# Patient Record
Sex: Male | Born: 1995 | Race: White | Hispanic: No | Marital: Single | Smoking: Current every day smoker
Health system: Southern US, Community
[De-identification: ages and names within clinical notes are randomized; demographics above are authoritative.]

---
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
## 2017-03-01 ENCOUNTER — Encounter (HOSPITAL_COMMUNITY): Payer: Self-pay | Admitting: *Deleted

## 2017-03-01 ENCOUNTER — Ambulatory Visit (INDEPENDENT_AMBULATORY_CARE_PROVIDER_SITE_OTHER): Payer: Medicaid Other

## 2017-03-01 ENCOUNTER — Ambulatory Visit (HOSPITAL_COMMUNITY)
Admission: EM | Admit: 2017-03-01 | Discharge: 2017-03-01 | Disposition: A | Discharge status: Home / Self Care | Payer: Medicaid Other | Attending: Family Medicine | Admitting: Family Medicine

## 2017-03-01 DIAGNOSIS — R0989 Other specified symptoms and signs involving the circulatory and respiratory systems: Secondary | ICD-10-CM | POA: Diagnosis not present

## 2017-03-01 MED ORDER — BENZONATATE 100 MG PO CAPS: 100.0000 mg | ORAL_CAPSULE | Freq: Three times a day (TID) | ORAL | 0 refills | Status: AC

## 2017-03-01 MED ORDER — AZITHROMYCIN 250 MG PO TABS: 250.0000 mg | ORAL_TABLET | Freq: Every day | ORAL | 0 refills | Status: AC

## 2017-03-01 MED ORDER — ALBUTEROL SULFATE HFA 108 (90 BASE) MCG/ACT IN AERS: 1.0000 | INHALATION_SPRAY | Freq: Four times a day (QID) | RESPIRATORY_TRACT | 0 refills | Status: AC | PRN

## 2017-03-01 MED ORDER — PREDNISONE 50 MG PO TABS: ORAL_TABLET | ORAL | 0 refills | Status: AC

## 2017-03-01 NOTE — Discharge Instructions (Signed)
I am treating you for possible pneumonia, started you on azithromycin, take 2 tablets today then 1 tablet daily till finished. Prednisone 1 tablet daily with food, for cough Tessalon 1 tablet every 8 hours as needed. For wheezing and shortness of breath albuterol inhaler 1-2 puffs every 4-6 hours as needed. Return to clinic in 2 weeks for follow-up assessment and possible reimaging. Wear respiratory protection while at work

## 2017-03-01 NOTE — ED Provider Notes (Signed)
CSN: [id number]     Arrival date & time 03/01/17  1110 History   None    Chief Complaint  Patient presents with  . Cough   (Consider location/radiation/quality/duration/timing/severity/associated sxs/prior Treatment) Luis Stevens is a 21 y.o. male with no reported past medical history, who presents to the [facility] with a chief complaint of cough that is been productive, hacking ongoing for [redacted] weeks along with general malaise. He denies any fever, or chills, no nausea, vomiting, or diarrhea, or other significant symptoms. He has not traveled outside of the country, he does smoke and he also vapes, and he also works in a [employer]. Family history is noncontributory.   The history is provided by the patient.  Cough  Associated symptoms: shortness of breath and wheezing     History reviewed. No pertinent past medical history. History reviewed. No pertinent surgical history. History reviewed. No pertinent family history. Social History  Substance Use Topics  . Smoking status: Current Every Day Smoker    Packs/day: 1.00    Years: 3.00    Types: Cigarettes  . Smokeless tobacco: Never Used  . Alcohol use No    Review of Systems  Constitutional: Negative.   HENT: Negative.   Respiratory: Positive for cough, shortness of breath and wheezing.   Cardiovascular: Negative.   Gastrointestinal: Negative.   Musculoskeletal: Negative.   Skin: Negative.   Neurological: Negative.     Allergies  Patient has no known allergies.  Home Medications   Prior to Admission medications   Medication Sig Start Date End Date Taking? Authorizing Provider  albuterol (PROVENTIL HFA;VENTOLIN HFA) 108 (90 Base) MCG/ACT inhaler Inhale 1-2 puffs into the lungs every 6 (six) hours as needed for wheezing or shortness of breath. 03/01/17   Dorena Bodo, NP  azithromycin (ZITHROMAX) 250 MG tablet Take 1 tablet (250 mg total) by mouth daily. Take first 2 tablets  together, then 1 every day until finished. 03/01/17   Dorena Bodo, NP  benzonatate (TESSALON) 100 MG capsule Take 1 capsule (100 mg total) by mouth every 8 (eight) hours. 03/01/17   Dorena Bodo, NP  predniSONE (DELTASONE) 50 MG tablet Take 1 tablet daily with food 03/01/17   Dorena Bodo, NP   Meds Ordered and Administered this Visit  Medications - No data to display  BP 122/74 (BP Location: Right Arm)   Pulse 78   Temp 98.6 F (37 C) (Oral)   Resp 18   SpO2 100%  No data found.   Physical Exam  Constitutional: He is oriented to person, place, and time. He appears well-developed and well-nourished. No distress.  HENT:  Head: Normocephalic and atraumatic.  Right Ear: External ear normal.  Left Ear: External ear normal.  Eyes: Conjunctivae are normal.  Neck: Normal range of motion.  Cardiovascular: Normal rate and regular rhythm.   Pulmonary/Chest: Effort normal. He has rhonchi in the right lower field.  Neurological: He is alert and oriented to person, place, and time.  Skin: Skin is warm and dry. Capillary refill takes less than 2 seconds. He is not diaphoretic.  Psychiatric: He has a normal mood and affect. His behavior is normal.  Nursing note and vitals reviewed.   Urgent Care Course     Procedures (including critical care time)  Labs Review Labs Reviewed - No data to display  Imaging Review Dg Chest 2 View  Result Date: 03/01/2017 CLINICAL DATA:  Cough.  Shortness of breath. EXAM: CHEST  2 VIEW  COMPARISON:  02/20/2013 FINDINGS: Ill-defined abnormal density overlies the anterior aspect of the right first rib. I suspect this represents a healing right first rib fracture. I doubt that it represents a mass or infiltrate in the right apex. The lungs are otherwise clear. Heart size and vascularity are normal. No effusions. The bones otherwise appear normal. IMPRESSION: Probable healing fracture of the anterior aspect of the right first rib. Does the patient have  a history of upper right chest trauma? Otherwise, normal exam. Electronically Signed   By: Francene BoyersJames  Maxwell M.D.   On: 03/01/2017 12:32       MDM   1. Abnormal lung sounds     Rhonchi significant right lower fields, chest x-ray negative for pneumonia, however there was a abnormality seen at anterior right first rib. He denies any history of trauma to this area, he does not have any pain. Recommend returning to clinic in 2 weeks for repeat images. He was started on azithromycin, prednisone, Tessalon, and albuterol. Provided counseling and follow-up guidelines as well as a work note for today.    Dorena BodoKennard, Marlea Gambill, NP 03/01/17 1312

## 2017-03-01 NOTE — ED Triage Notes (Signed)
Pt  Is  A   Smoker  He  Has  A  Cough   X   2   Weeks    With  Runny  Nose  At times          Pt  Is  Sitting  Upright  On  The  Exam table  In  Complete   sentances

## 2021-04-04 ENCOUNTER — Ambulatory Visit (INDEPENDENT_AMBULATORY_CARE_PROVIDER_SITE_OTHER): Payer: Self-pay

## 2021-04-04 ENCOUNTER — Other Ambulatory Visit: Payer: Self-pay

## 2021-04-04 ENCOUNTER — Other Ambulatory Visit: Payer: Self-pay | Admitting: Physician Assistant

## 2021-04-04 DIAGNOSIS — Z021 Encounter for pre-employment examination: Secondary | ICD-10-CM

## 2022-10-09 IMAGING — DX DG CHEST 1V
1 series · 1 of 1 positions shown · non-contrast
Comparison: 03/01/2017

CLINICAL DATA: Pre-employment examination.

EXAM:
CHEST  1 VIEW

[chest pa]
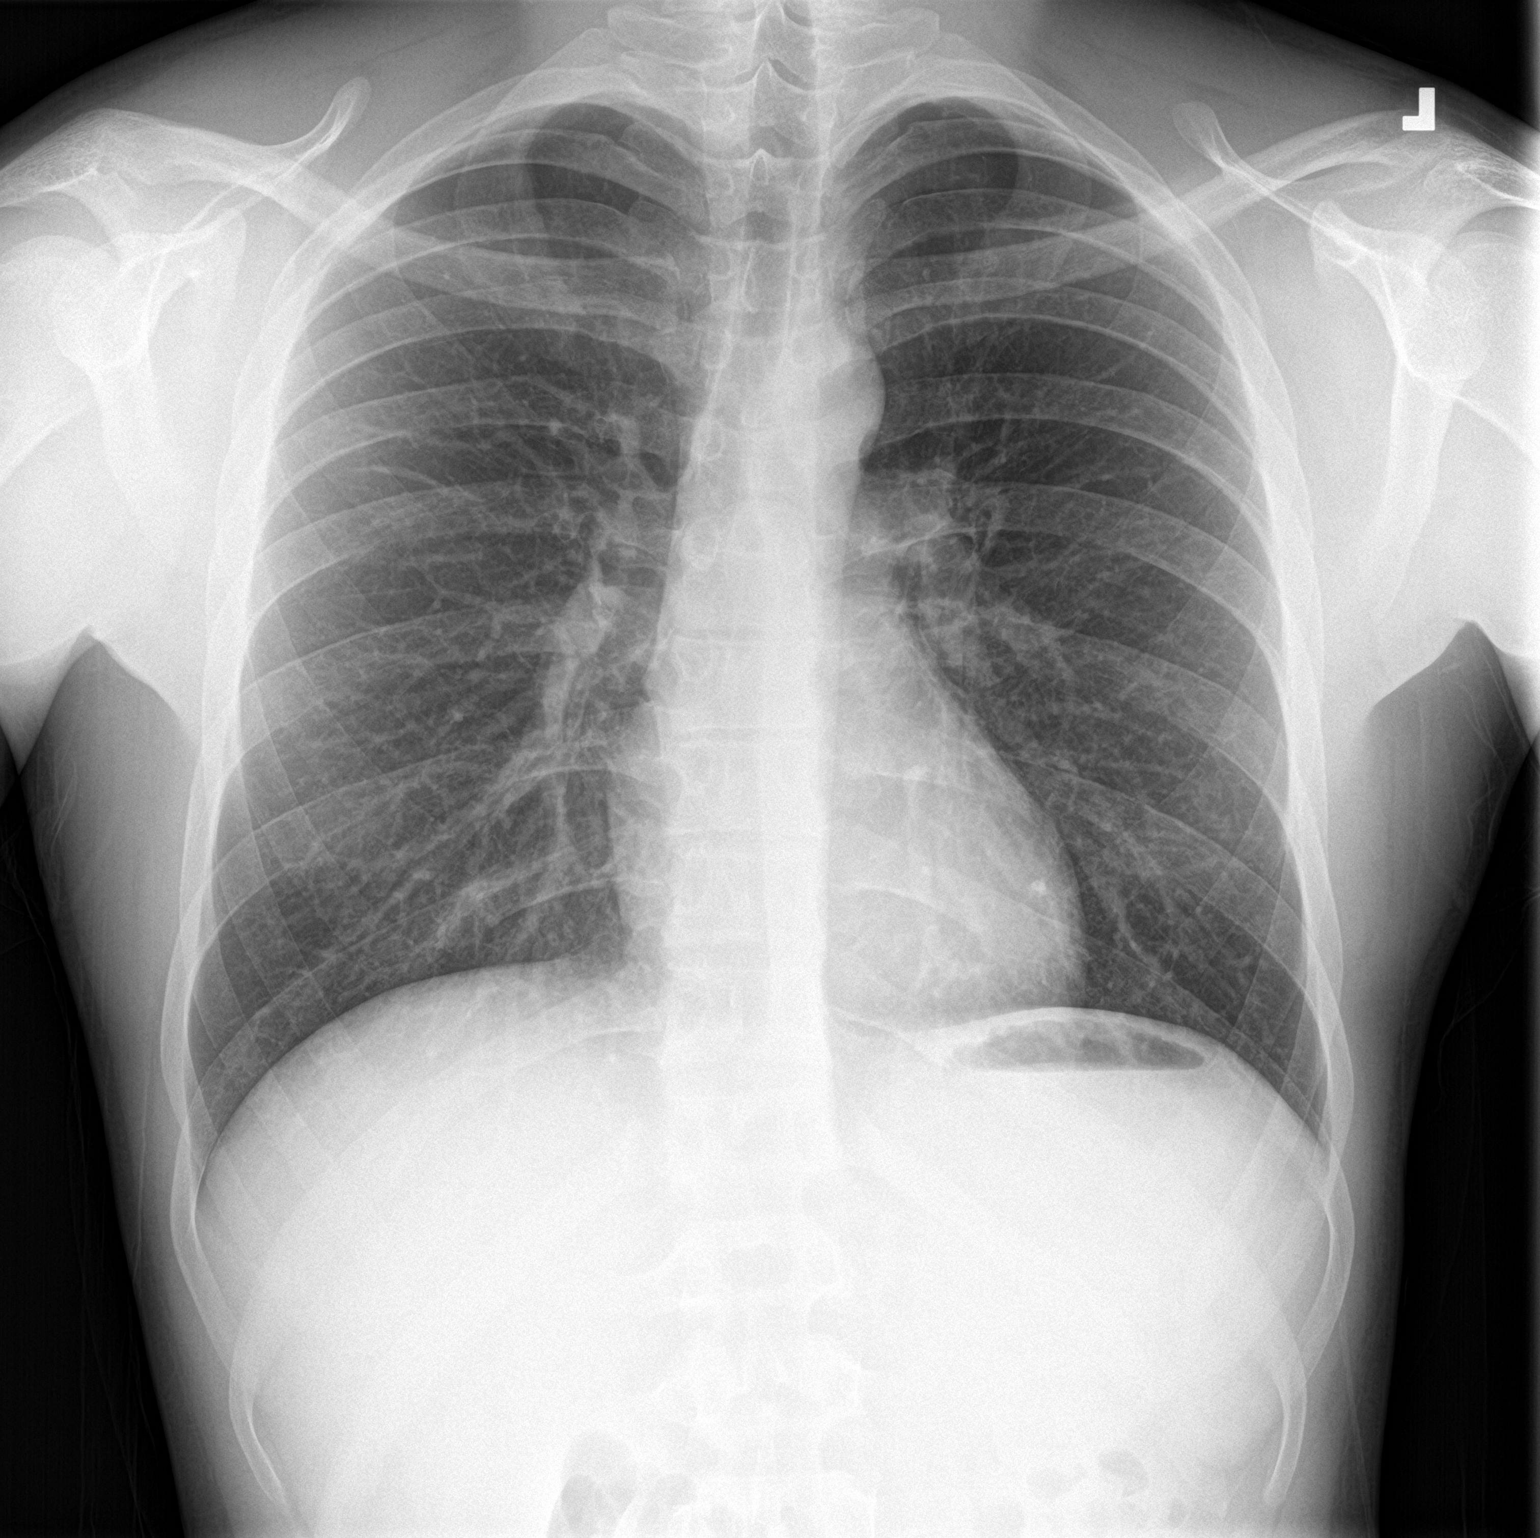

[1 of 1 positions shown; findings below may reference images not displayed]

FINDINGS: The heart size and mediastinal contours are within normal limits.
Both lungs are clear. The visualized skeletal structures are
unremarkable. Negative for a pneumothorax.
IMPRESSION: No active disease.
